# Patient Record
Sex: Female | Born: 1970 | Race: White | Hispanic: No | Smoking: Current every day smoker
Health system: Southern US, Community
[De-identification: ages and names within clinical notes are randomized; demographics above are authoritative.]

## PROBLEM LIST (undated history)

## (undated) DIAGNOSIS — E7800 Pure hypercholesterolemia, unspecified: Secondary | ICD-10-CM

## (undated) DIAGNOSIS — G43909 Migraine, unspecified, not intractable, without status migrainosus: Secondary | ICD-10-CM

## (undated) DIAGNOSIS — E0590 Thyrotoxicosis, unspecified without thyrotoxic crisis or storm: Secondary | ICD-10-CM

## (undated) DIAGNOSIS — N809 Endometriosis, unspecified: Secondary | ICD-10-CM

## (undated) DIAGNOSIS — R000 Tachycardia, unspecified: Secondary | ICD-10-CM

## (undated) DIAGNOSIS — R202 Paresthesia of skin: Secondary | ICD-10-CM

## (undated) DIAGNOSIS — G43009 Migraine without aura, not intractable, without status migrainosus: Secondary | ICD-10-CM

## (undated) HISTORY — PX: NASAL SINUS SURGERY: SHX719

## (undated) HISTORY — PX: VAGINAL HYSTERECTOMY: SUR661

## (undated) HISTORY — DX: Migraine, unspecified, not intractable, without status migrainosus: G43.909

## (undated) HISTORY — PX: EXPLORATORY LAPAROTOMY: SUR591

## (undated) HISTORY — DX: Migraine without aura, not intractable, without status migrainosus: G43.009

## (undated) HISTORY — DX: Paresthesia of skin: R20.2

## (undated) HISTORY — DX: Endometriosis, unspecified: N80.9

## (undated) HISTORY — DX: Thyrotoxicosis, unspecified without thyrotoxic crisis or storm: E05.90

## (undated) HISTORY — DX: Pure hypercholesterolemia, unspecified: E78.00

## (undated) HISTORY — PX: CHOLECYSTECTOMY: SHX55

---
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
## 2008-04-30 ENCOUNTER — Ambulatory Visit: Payer: Self-pay | Admitting: Family

## 2008-12-19 ENCOUNTER — Ambulatory Visit: Payer: Self-pay | Admitting: Family

## 2009-11-09 IMAGING — US ULTRASOUND RIGHT BREAST
1 series · 14 of 14 positions shown · non-contrast
Comparison: none

REASON FOR EXAM: Papable breast tenderness right breast
COMMENTS:

[Series 1: ultrasound right breast · 14 of 14 slices shown]
[im 1/14]
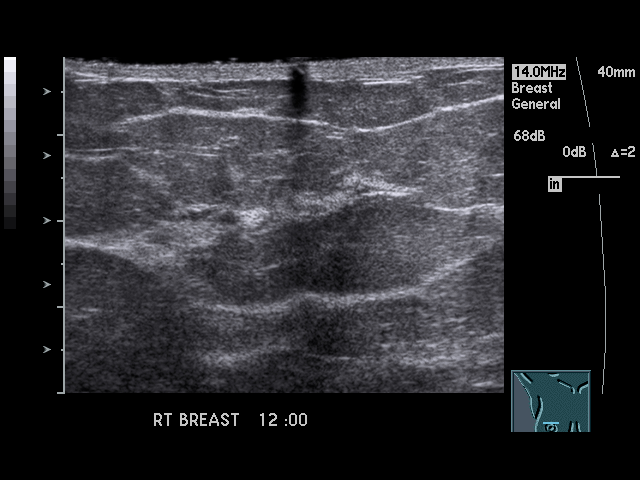
[im 2/14]
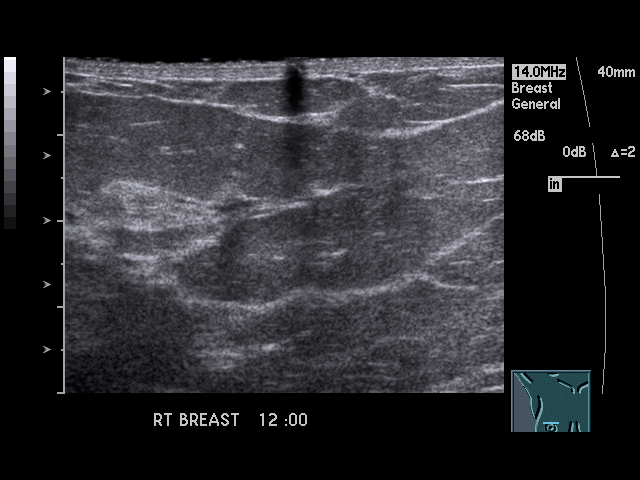
[im 3/14]
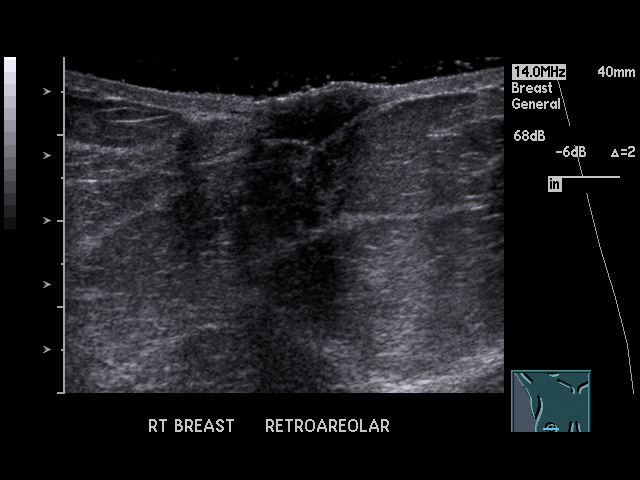
[im 4/14]
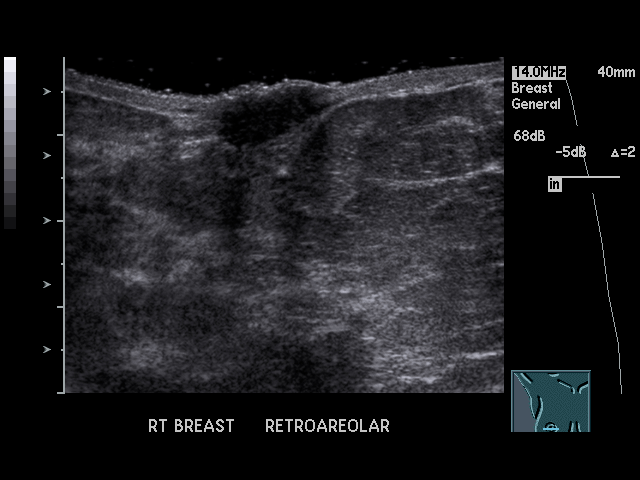
[im 5/14]
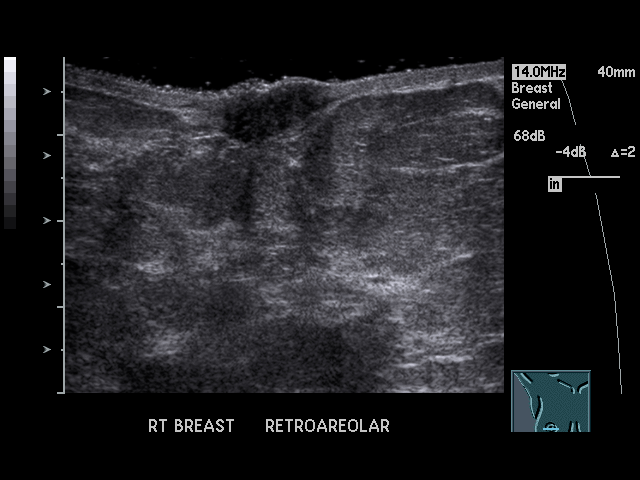
[im 6/14]
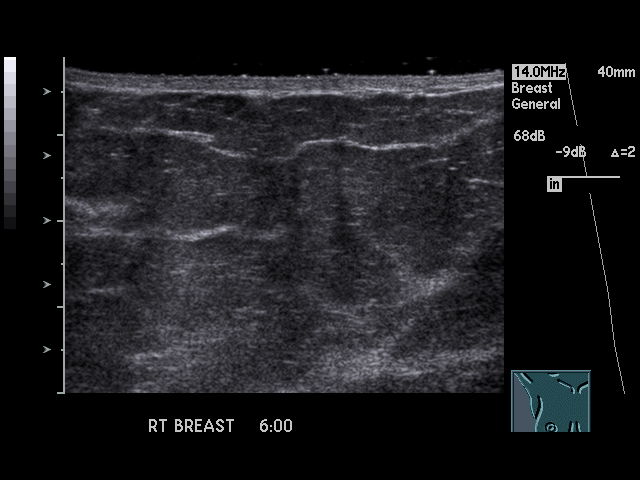
[im 7/14]
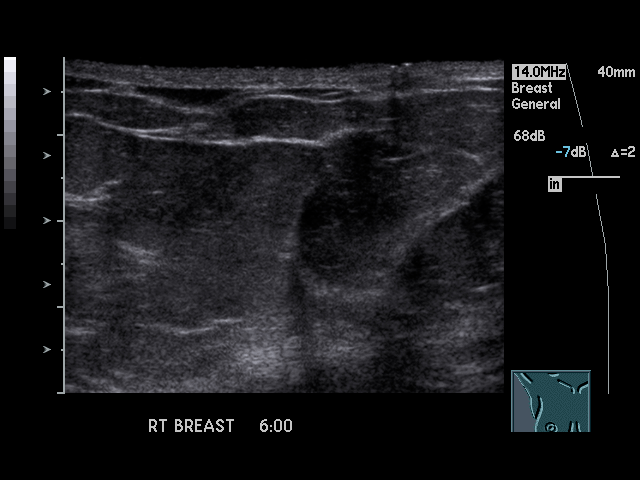
[im 8/14]
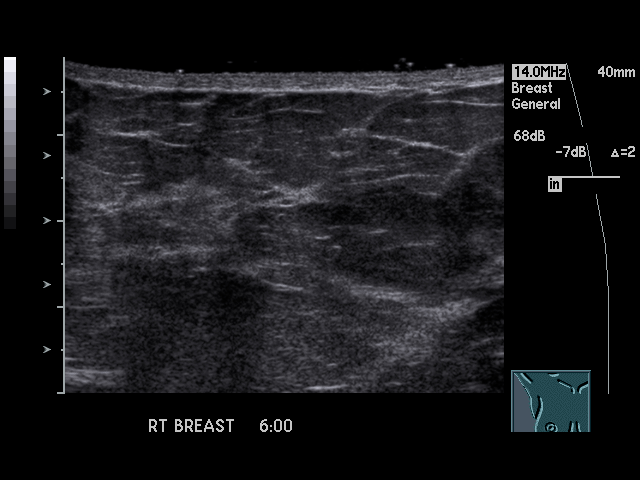
[im 9/14]
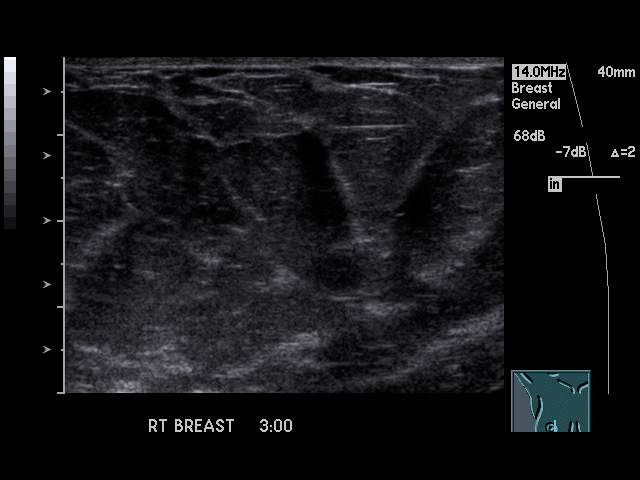
[im 10/14]
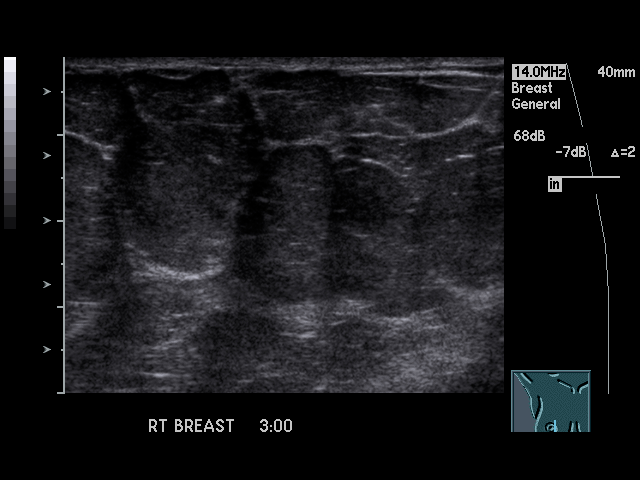
[im 11/14]
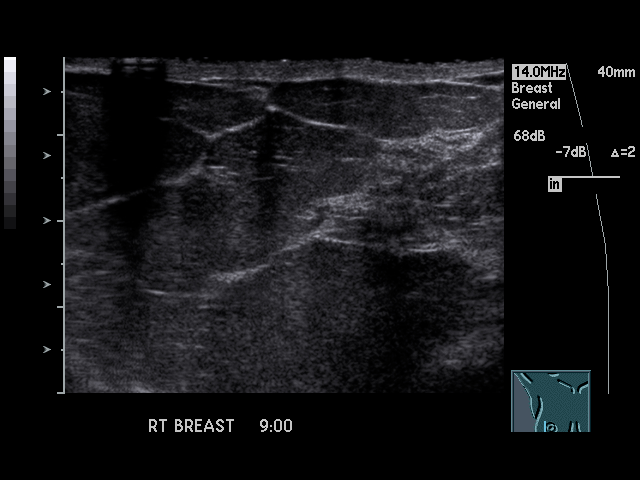
[im 12/14]
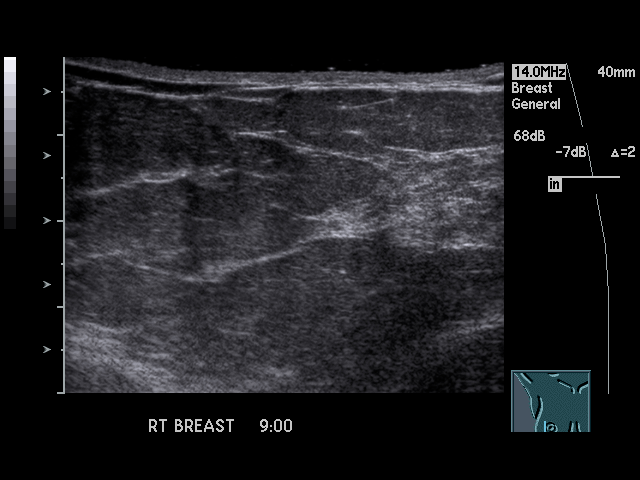
[im 13/14]
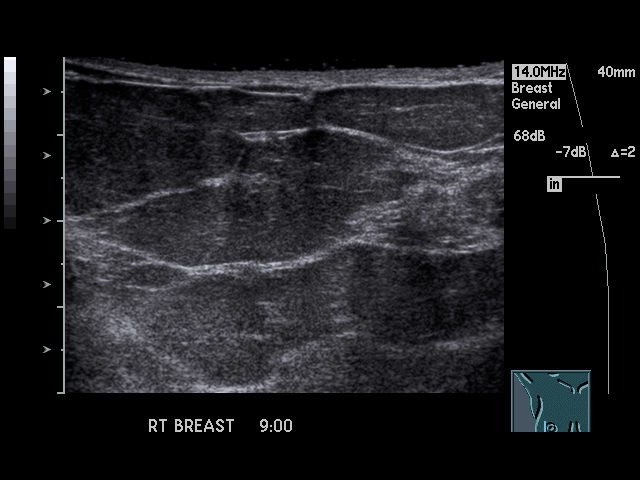
[im 14/14]
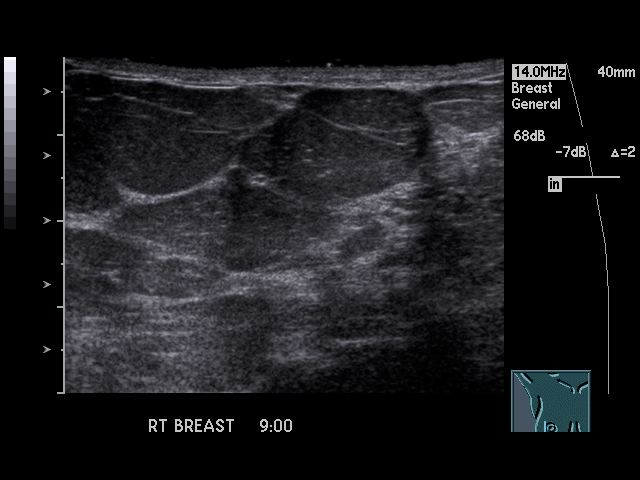

[14 of 14 positions shown; findings below may reference images not displayed]

PROCEDURE:     US  - US BREAST RIGHT  - April 30, 2008  [DATE]

RESULT:     The patient reports tenderness inferior to the RIGHT areolar
region.

Targeted Ultrasound was performed and shows no significant sonographic
abnormalities. No mass is seen. No skin thickening is noted. No dilated
ducts are observed.
IMPRESSION: Benign appearing Targeted RIGHT Breast Ultrasound.

## 2011-09-23 ENCOUNTER — Emergency Department: Payer: Self-pay | Admitting: Unknown Physician Specialty

## 2011-09-23 LAB — COMPREHENSIVE METABOLIC PANEL
Albumin: 3.7 g/dL (ref 3.4–5.0)
Alkaline Phosphatase: 97 U/L (ref 50–136)
Anion Gap: 7 (ref 7–16)
Bilirubin,Total: 0.4 mg/dL (ref 0.2–1.0)
Chloride: 105 mmol/L (ref 98–107)
Co2: 25 mmol/L (ref 21–32)
Creatinine: 0.63 mg/dL (ref 0.60–1.30)
EGFR (Non-African Amer.): >60
Glucose: 86 mg/dL (ref 65–99)
Osmolality: 272 (ref 275–301)
Potassium: 3.3 mmol/L — ABNORMAL LOW (ref 3.5–5.1)
SGPT (ALT): 23 U/L
Sodium: 137 mmol/L (ref 136–145)
Total Protein: 7.8 g/dL (ref 6.4–8.2)

## 2011-09-23 LAB — CBC
MCH: 33.8 pg (ref 26.0–34.0)
MCHC: 35.5 g/dL (ref 32.0–36.0)
Platelet: 242 10*3/uL (ref 150–440)
RBC: 4.68 10*6/uL (ref 3.80–5.20)

## 2011-09-23 LAB — LIPASE, BLOOD: Lipase: 138 U/L (ref 73–393)

## 2012-10-18 ENCOUNTER — Ambulatory Visit: Payer: Self-pay | Admitting: Family Medicine

## 2013-12-19 ENCOUNTER — Ambulatory Visit: Payer: Self-pay | Admitting: Family Medicine

## 2014-12-17 ENCOUNTER — Other Ambulatory Visit: Payer: Self-pay | Admitting: Family Medicine

## 2014-12-17 ENCOUNTER — Ambulatory Visit
Admission: RE | Admit: 2014-12-17 | Discharge: 2014-12-17 | Disposition: A | Discharge status: Home / Self Care | Payer: No Typology Code available for payment source | Source: Ambulatory Visit | Attending: Family Medicine | Admitting: Family Medicine

## 2014-12-17 DIAGNOSIS — R1031 Right lower quadrant pain: Secondary | ICD-10-CM | POA: Diagnosis not present

## 2014-12-17 DIAGNOSIS — R1084 Generalized abdominal pain: Secondary | ICD-10-CM

## 2014-12-17 HISTORY — DX: Tachycardia, unspecified: R00.0

## 2014-12-17 MED ORDER — IOHEXOL 300 MG/ML  SOLN
100.0000 mL | Freq: Once | INTRAMUSCULAR | Status: AC | PRN
  Administered 2014-12-17: 100 mL via INTRAVENOUS

## 2015-11-04 ENCOUNTER — Ambulatory Visit (INDEPENDENT_AMBULATORY_CARE_PROVIDER_SITE_OTHER): Payer: BLUE CROSS/BLUE SHIELD | Admitting: Neurology

## 2015-11-04 ENCOUNTER — Encounter: Payer: Self-pay | Admitting: Neurology

## 2015-11-04 VITALS — BP 131/83 | HR 89 | Ht 66.0 in | Wt 218.0 lb

## 2015-11-04 DIAGNOSIS — M25569 Pain in unspecified knee: Secondary | ICD-10-CM | POA: Insufficient documentation

## 2015-11-04 DIAGNOSIS — M25561 Pain in right knee: Secondary | ICD-10-CM | POA: Diagnosis not present

## 2015-11-04 DIAGNOSIS — G43009 Migraine without aura, not intractable, without status migrainosus: Secondary | ICD-10-CM | POA: Diagnosis not present

## 2015-11-04 DIAGNOSIS — M25562 Pain in left knee: Secondary | ICD-10-CM | POA: Diagnosis not present

## 2015-11-04 DIAGNOSIS — R202 Paresthesia of skin: Secondary | ICD-10-CM | POA: Diagnosis not present

## 2015-11-04 HISTORY — DX: Paresthesia of skin: R20.2

## 2015-11-04 HISTORY — DX: Migraine without aura, not intractable, without status migrainosus: G43.009

## 2015-11-04 NOTE — Progress Notes (Signed)
Reason for visit: Back pain, leg pain  Referring physician: Dr. Elza RafterAldridge  Elizabeth Carrillo is a 45 y.o. female  History of present illness:  Elizabeth Carrillo is a 45 year old right-handed white female with a history of migraine headaches occurring on average about once a week, treated with Excedrin Migraine and amitriptyline. The patient has a history of back pain and right-sided sciatica dating back to 2003. The pain is still there to some degree, but is less severe than it was initially. The patient has had some similar problems with the left side beginning two-years ago. The patient now believes that the left leg is worse than the right. The patient reports some discomfort going down both legs, but she now has achy pains from the mid thigh level down to the feet, she has some paresthesias involving the calf muscle area bilaterally, not involving the feet. The patient reports some left foot pain associated with plantar fasciitis. She indicates that her discomfort is worse when she is sitting, she feels better when she is up and moving around. The patient denies any weakness in the legs. She denies any difficulty with balance or difficulty controlling the bowels or the bladder. The patient does have some tightness in the neck and shoulders, she denies overt neck pain or pain down the arms. She denies any numbness or weakness of the arms. She has undergone an EMG and nerve conduction study that suggested a sensory neuropathy, but I reviewed the study which reveals that the study was done only on the left leg, plantar sensory latencies were done bilaterally, only the medial plantar sensory latency on the left foot was minimally prolonged with normal sensory amplitudes. The study was essentially normal, there is no evidence of a peripheral neuropathy. She is sent to this office for an evaluation. Other blood work has been done including a comprehensive metabolic profile, C-reactive protein, sedimentation  rate, rheumatoid factor, ANA, and vitamin B12 level, the studies were relatively unremarkable. A hemoglobin A1c was 5.4. A TSH done in August 2016 was elevated at 7.16.  Past Medical History  Diagnosis Date  . Tachycardia   . Endometriosis   . Hyperthyroidism   . High cholesterol   . Migraine   . Paresthesia 11/04/2015  . Common migraine 11/04/2015    Past Surgical History  Procedure Laterality Date  . Rhinoplasty  1992  . Vaginal hysterectomy  Age 45  . Cholecystectomy  Age 45  . Nasal sinus surgery  Age [age]  . Exploratory laparotomy  Age 45    x 4    Family History  Problem Relation Age of Onset  . Cancer Mother   . Cancer Father   . High Cholesterol Mother   . High Cholesterol Brother   . High Cholesterol Sister   . High Cholesterol Maternal Grandmother   . Heart disease Maternal Grandmother   . Diabetes Paternal Grandfather     Social history:  reports that she has been smoking Cigarettes.  She has been smoking about 0.20 packs per day. She does not have any smokeless tobacco history on file. She reports that she drinks alcohol. She reports that she does not use illicit drugs.  Medications:  Prior to Admission medications   Not on File      Allergies  Allergen Reactions  . Codeine Hives  . Lactose Nausea Only    Other reaction(s): Abdominal Pain    ROS:  Out of a complete 14 system review of symptoms, the patient  complains only of the following symptoms, and all other reviewed systems are negative.  Weight gain Constipation Increased thirst Achy muscles  Blood pressure 131/83, pulse 89, height 5\' 6"  (1.676 m), weight 218 lb (98.884 kg).  Physical Exam  General: The patient is alert and cooperative at the time of the examination. The patient is moderately to markedly obese.  Eyes: Pupils are equal, round, and reactive to light. Discs are flat bilaterally.  Neck: The neck is supple, no carotid bruits are noted.  Respiratory: The respiratory  examination is clear.  Cardiovascular: The cardiovascular examination reveals a regular rate and rhythm, no obvious murmurs or rubs are noted.  Neuromuscular: Range of movement of the low back is full. There is some tenderness over the right SI joint.  Skin: Extremities are with 1+ edema at the ankles bilaterally.  Neurologic Exam  Mental status: The patient is alert and oriented x 3 at the time of the examination. The patient has apparent normal recent and remote memory, with an apparently normal attention span and concentration ability.  Cranial nerves: Facial symmetry is present. There is good sensation of the face to pinprick and soft touch bilaterally. The strength of the facial muscles and the muscles to head turning and shoulder shrug are normal bilaterally. Speech is well enunciated, no aphasia or dysarthria is noted. Extraocular movements are full. Visual fields are full. The tongue is midline, and the patient has symmetric elevation of the soft palate. No obvious hearing deficits are noted.  Motor: The motor testing reveals 5 over 5 strength of all 4 extremities. Good symmetric motor tone is noted throughout.  Sensory: Sensory testing is intact to pinprick, soft touch, vibration sensation, and position sense on all 4 extremities. No evidence of extinction is noted.  Coordination: Cerebellar testing reveals good finger-nose-finger and heel-to-shin bilaterally.  Gait and station: Gait is normal. Tandem gait is normal. Romberg is negative. No drift is seen. The patient is able to walk on heels and the toes bilaterally.  Reflexes: Deep tendon reflexes are symmetric and normal bilaterally. Toes are downgoing bilaterally.   Assessment/Plan:  1. Chronic bilateral sciatica pain   2. Migraine headache  3. Obesity  The patient has chronic discomfort down both legs. She likely does not have a peripheral neuropathy. We will check MRI evaluation of the lumbosacral spine, the patient may  be amenable to epidural steroid injection. She is on Lyrica with some benefit, this seems to work better than the gabapentin. She is reporting some ankle swelling on the medication. She will follow-up in 3-4 months.  Marlan Palau. Keith Aissa Lisowski MD 11/04/2015 7:06 PM  [facility] [address] [place], [place] [id number]  Phone [phone] Fax [phone]

## 2015-11-04 NOTE — Patient Instructions (Signed)

## 2015-11-08 ENCOUNTER — Encounter: Payer: Self-pay | Admitting: Neurology

## 2015-12-02 ENCOUNTER — Ambulatory Visit
Admission: RE | Admit: 2015-12-02 | Discharge: 2015-12-02 | Disposition: A | Discharge status: Home / Self Care | Payer: BLUE CROSS/BLUE SHIELD | Source: Ambulatory Visit | Attending: Neurology | Admitting: Neurology

## 2015-12-02 ENCOUNTER — Telehealth: Payer: Self-pay | Admitting: Neurology

## 2015-12-02 DIAGNOSIS — M25562 Pain in left knee: Secondary | ICD-10-CM

## 2015-12-02 DIAGNOSIS — R202 Paresthesia of skin: Secondary | ICD-10-CM

## 2015-12-02 DIAGNOSIS — M25561 Pain in right knee: Secondary | ICD-10-CM

## 2015-12-02 NOTE — Telephone Encounter (Signed)
MRI of the lumbar spine shows minimal facet joint arthritis, no nerve root impingement. Would continue with medical therapy at this point, not sure that an epidural steroid injection with help her. Chiropractic treatments may be of some benefit.   MRI lumbar 12/02/15:  IMPRESSION: Mild facet degeneration L4-5 and L5-S1. No disc protrusion or neural impingement.

## 2016-03-07 ENCOUNTER — Ambulatory Visit: Payer: BLUE CROSS/BLUE SHIELD | Admitting: Adult Health

## 2016-03-08 ENCOUNTER — Encounter: Payer: Self-pay | Admitting: Adult Health

## 2017-01-08 ENCOUNTER — Emergency Department
Admission: EM | Admit: 2017-01-08 | Discharge: 2017-01-08 | Disposition: A | Discharge status: Home Health | Payer: Self-pay | Attending: Emergency Medicine | Admitting: Emergency Medicine

## 2017-01-08 ENCOUNTER — Emergency Department: Payer: Self-pay

## 2017-01-08 ENCOUNTER — Encounter: Payer: Self-pay | Admitting: Emergency Medicine

## 2017-01-08 DIAGNOSIS — Z79899 Other long term (current) drug therapy: Secondary | ICD-10-CM | POA: Insufficient documentation

## 2017-01-08 DIAGNOSIS — J189 Pneumonia, unspecified organism: Secondary | ICD-10-CM

## 2017-01-08 DIAGNOSIS — J181 Lobar pneumonia, unspecified organism: Secondary | ICD-10-CM | POA: Insufficient documentation

## 2017-01-08 DIAGNOSIS — F17210 Nicotine dependence, cigarettes, uncomplicated: Secondary | ICD-10-CM | POA: Insufficient documentation

## 2017-01-08 MED ORDER — BENZONATATE 100 MG PO CAPS: 200.0000 mg | ORAL_CAPSULE | Freq: Three times a day (TID) | ORAL | 0 refills | Status: AC | PRN

## 2017-01-08 MED ORDER — PREDNISONE 20 MG PO TABS
40.0000 mg | ORAL_TABLET | Freq: Every day | ORAL | Status: DC
  Administered 2017-01-08: 40 mg via ORAL

## 2017-01-08 MED ORDER — AZITHROMYCIN 250 MG PO TABS: ORAL_TABLET | ORAL | 0 refills | Status: AC

## 2017-01-08 MED ORDER — IPRATROPIUM-ALBUTEROL 0.5-2.5 (3) MG/3ML IN SOLN
3.0000 mL | Freq: Once | RESPIRATORY_TRACT | Status: AC
  Administered 2017-01-08: 3 mL via RESPIRATORY_TRACT
  Filled 2017-01-08: qty 3

## 2017-01-08 MED ORDER — AZITHROMYCIN 250 MG PO TABS: ORAL_TABLET | ORAL | 0 refills | Status: DC

## 2017-01-08 MED ORDER — AZITHROMYCIN 500 MG PO TABS
500.0000 mg | ORAL_TABLET | Freq: Once | ORAL | Status: AC
  Administered 2017-01-08: 500 mg via ORAL
  Filled 2017-01-08: qty 1

## 2017-01-08 MED ORDER — PREDNISONE 20 MG PO TABS
ORAL_TABLET | ORAL | Status: AC
  Filled 2017-01-08: qty 2

## 2017-01-08 MED ORDER — BENZONATATE 100 MG PO CAPS
200.0000 mg | ORAL_CAPSULE | Freq: Once | ORAL | Status: AC
  Administered 2017-01-08: 200 mg via ORAL
  Filled 2017-01-08: qty 2

## 2017-01-08 MED ORDER — BENZONATATE 100 MG PO CAPS: 200.0000 mg | ORAL_CAPSULE | Freq: Three times a day (TID) | ORAL | 0 refills | Status: DC | PRN

## 2017-01-08 NOTE — ED Triage Notes (Signed)
Pt c/o cold sxs x over 1 week. States she has had cold sweats and fevers at onset, but states now she is having a productive cough yellow thick sputum. Cough worse at night unable to lay down.   Pt is a smoker. No hx of asthma, pneumonia or COPD.

## 2017-01-08 NOTE — ED Provider Notes (Signed)
[facility] Emergency Department Provider Note  ___________________________________________   First MD Initiated Contact with Patient 01/08/17 1612     (approximate)  I have reviewed the triage vital signs and the nursing notes.   HISTORY  Chief Complaint Cough   HPI Hanalei Talma Aguillard is a 46 y.o. female is here with complaint of upper respiratory symptoms for over a week. Patient states that she has had some "cold sweats" and fever at times. She now complains of a yellow productive cough. She states cough is worse at night when she lies down. Patient denies any history of asthma or pneumonia in the past. Patient is a smoker at one pack per day. Patient is tried some over-the-counter medication without any relief of her symptoms. She is continued to work with these symptoms.   Past Medical History:  Diagnosis Date  . Common migraine 11/04/2015  . Endometriosis   . High cholesterol   . Hyperthyroidism   . Migraine   . Paresthesia 11/04/2015  . Tachycardia     Patient Active Problem List   Diagnosis Date Noted  . Paresthesia 11/04/2015  . Pain in joint, lower leg 11/04/2015  . Common migraine 11/04/2015    Past Surgical History:  Procedure Laterality Date  . CHOLECYSTECTOMY  Age 85  . EXPLORATORY LAPAROTOMY  Age 40   x 4  . NASAL SINUS SURGERY  Age 40  . RHINOPLASTY  1992  . VAGINAL HYSTERECTOMY  Age 77    Prior to Admission medications   Medication Sig Start Date End Date Taking? Authorizing Provider  amitriptyline (ELAVIL) 50 MG tablet Take 50 mg by mouth. 03/31/15 12/30/15  [provider]  azithromycin (ZITHROMAX Z-PAK) 250 MG tablet Take 1 tablet once a day for the next 4 days starting 01/09/17 01/08/17   Tommi Rumps, PA-C  benzonatate (TESSALON PERLES) 100 MG capsule Take 2 capsules (200 mg total) by mouth 3 (three) times daily as needed. 01/08/17 01/08/18  Tommi Rumps, PA-C  furosemide (LASIX) 40 MG tablet Take 40 mg by  mouth. 11/02/15 11/01/16  [provider]  levothyroxine (SYNTHROID, LEVOTHROID) 25 MCG tablet Take by mouth. 07/02/15 07/01/16  [provider]  lovastatin (MEVACOR) 40 MG tablet Take 40 mg by mouth. 03/31/15 12/30/15  [provider]  naproxen (NAPROXEN DR) 500 MG EC tablet Take 500 mg by mouth. 05/27/15   [provider]  pantoprazole (PROTONIX) 40 MG tablet Take 40 mg by mouth. 05/26/15 01/03/16  [provider]  pregabalin (LYRICA) 50 MG capsule Take 50 mg by mouth. 09/29/15 09/28/16  [provider]    Allergies Codeine and Lactose  Family History  Problem Relation Age of Onset  . Cancer Mother   . High Cholesterol Mother   . Cancer Father   . High Cholesterol Brother   . High Cholesterol Sister   . High Cholesterol Maternal Grandmother   . Heart disease Maternal Grandmother   . Diabetes Paternal Grandfather     Social History Social History  Substance Use Topics  . Smoking status: Current Every Day Smoker    Packs/day: 1.00    Types: Cigarettes  . Smokeless tobacco: Never Used  . Alcohol use 0.0 oz/week     Comment: occasional    Review of Systems Constitutional: No fever/chills Cardiovascular: Denies chest pain. Respiratory: Denies shortness of breath.Positive productive cough. Gastrointestinal:   No nausea, no vomiting. Skin: Negative for rash. Neurological: Negative for headaches ____________________________________________   PHYSICAL EXAM:  VITAL SIGNS: ED Triage Vitals  Enc Vitals Group     BP 01/08/17 1547 132/75     Pulse Rate 01/08/17 1547 (!) 113     Resp 01/08/17 1547 18     Temp 01/08/17 1547 99.5 F (37.5 C)     Temp Source 01/08/17 1547 Oral     SpO2 01/08/17 1547 95 %     Weight 01/08/17 1548 203 lb (92.1 kg)     Height 01/08/17 1548 5\' 7"  (1.702 m)     Head Circumference --      Peak Flow --      Pain Score 01/08/17 1600 0     Pain Loc --      Pain Edu? --      Excl. in GC? --     Constitutional: Alert and oriented. Well appearing and in no acute distress. Eyes: Conjunctivae are normal.  Head: Atraumatic. Nose: Mild congestion/rhinnorhea.  EACs and TMs are clear bilaterally. Mouth/Throat: Mucous membranes are moist.  Oropharynx non-erythematous. Neck: No stridor.   Hematological/Lymphatic/Immunilogical: No cervical lymphadenopathy. Cardiovascular: Normal rate, regular rhythm. Grossly normal heart sounds.  Good peripheral circulation. Respiratory: Normal respiratory effort.  No retractions. Lungs CTAB. Gastrointestinal: Soft and nontender. No distention.  Musculoskeletal: Moves upper and lower extremities without any difficulty. Normal gait was noted. Neurologic:  Normal speech and language. No gross focal neurologic deficits are appreciated.  Skin:  Skin is warm, dry and intact.  Psychiatric: Mood and affect are normal. Speech and behavior are normal.  ____________________________________________   LABS (all labs ordered are listed, but only abnormal results are displayed)  Labs Reviewed - No data to display  RADIOLOGY  Dg Chest 2 View  Result Date: 01/08/2017 CLINICAL DATA:  46 year old female with cough for 1 week. EXAM: CHEST  2 VIEW COMPARISON:  None. FINDINGS: Focal airspace opacity within the left upper lobe/lingula likely represents pneumonia. The cardiomediastinal silhouette is unremarkable. There is no evidence of pulmonary edema, suspicious pulmonary nodule/mass, pleural effusion, or pneumothorax. No acute bony abnormalities are identified. IMPRESSION: Left upper lobe/ lingular airspace disease compatible with pneumonia. Radiographic follow-up to resolution recommended. Electronically Signed   By: Harmon PierJeffrey  Hu M.D.   On: 01/08/2017 16:55    ____________________________________________   PROCEDURES  Procedure(s) performed: None  Procedures  Critical Care performed: No  ____________________________________________   INITIAL IMPRESSION /  ASSESSMENT AND PLAN / ED COURSE  Pertinent labs & imaging results that were available during my care of the patient were reviewed by me and considered in my medical decision making (see chart for details).  Patient was given her first dose of Zithromax in the emergency department since it is doubtful that she will be able to get to the pharmacy before it closes on Sunday evening. She was given a prescription for Zithromax 1 tablet daily for the next 4 days. She also Tessalon Perles 200 mg every 8 hours as needed for cough. Patient does not have a PCP. She was given a list of clinics that she could follow-up with including [facility], [facility], [facility], [facility] or [facility]. Patient does not have insurance and was given a good Rx card and a coupon showing that the cheapest placed by her medication was here [facility] [employer] [facility].    ___________________________________________   FINAL CLINICAL IMPRESSION(S) / ED DIAGNOSES  Final diagnoses:  Community acquired pneumonia of left upper lobe of lung (HCC)      NEW MEDICATIONS  STARTED DURING THIS VISIT:  Discharge Medication List as of 01/08/2017  5:27 PM       Note:  This document was prepared using Dragon voice recognition software and may include unintentional dictation errors.    Tommi Rumps, PA-C 01/08/17 1830    Arnaldo Natal, MD 01/08/17 2120

## 2017-01-08 NOTE — Discharge Instructions (Addendum)
Follow-up with one of the doctors listed on your discharge papers. [facility] may call [facility], [facility], [facility], [facility], or [facility]. Your first dose of Zithromax and Tessalon was given in the emergency department since we are unsure what the pharmacies are open past 6 PM. Get  prescriptions filled tomorrow. A discount card is included in your discharge papers and a coupon showing that the cheapest place to buy this medication is at [facility].

## 2018-07-20 IMAGING — CR DG CHEST 2V
1 series · 2 of 2 positions shown · non-contrast
Comparison: None.

CLINICAL DATA: 46-year-old female with cough for 1 week.

EXAM:
CHEST  2 VIEW

[Series 1: w chest pa · 0.14mm/px · 2 of 2 slices shown]
[im 1/2]
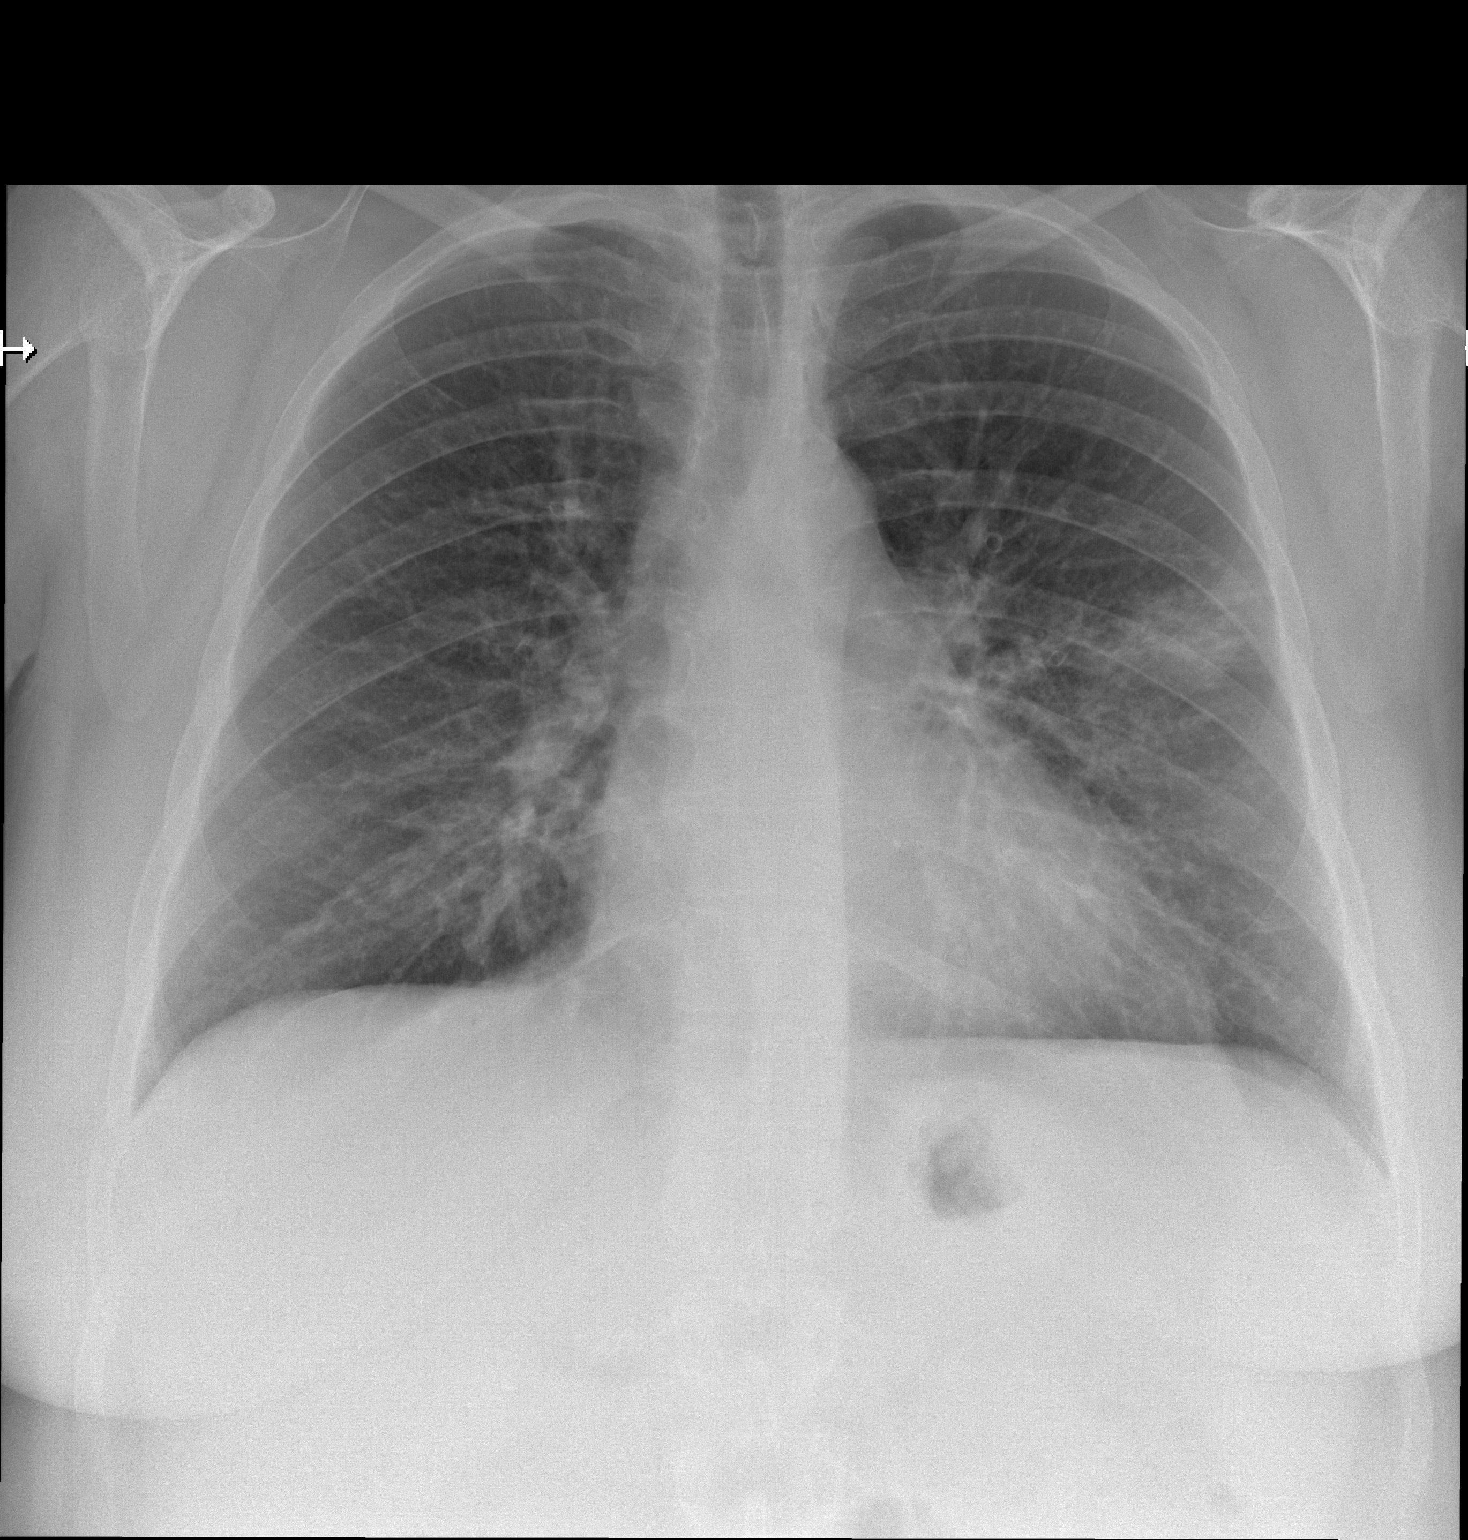
[im 2/2]
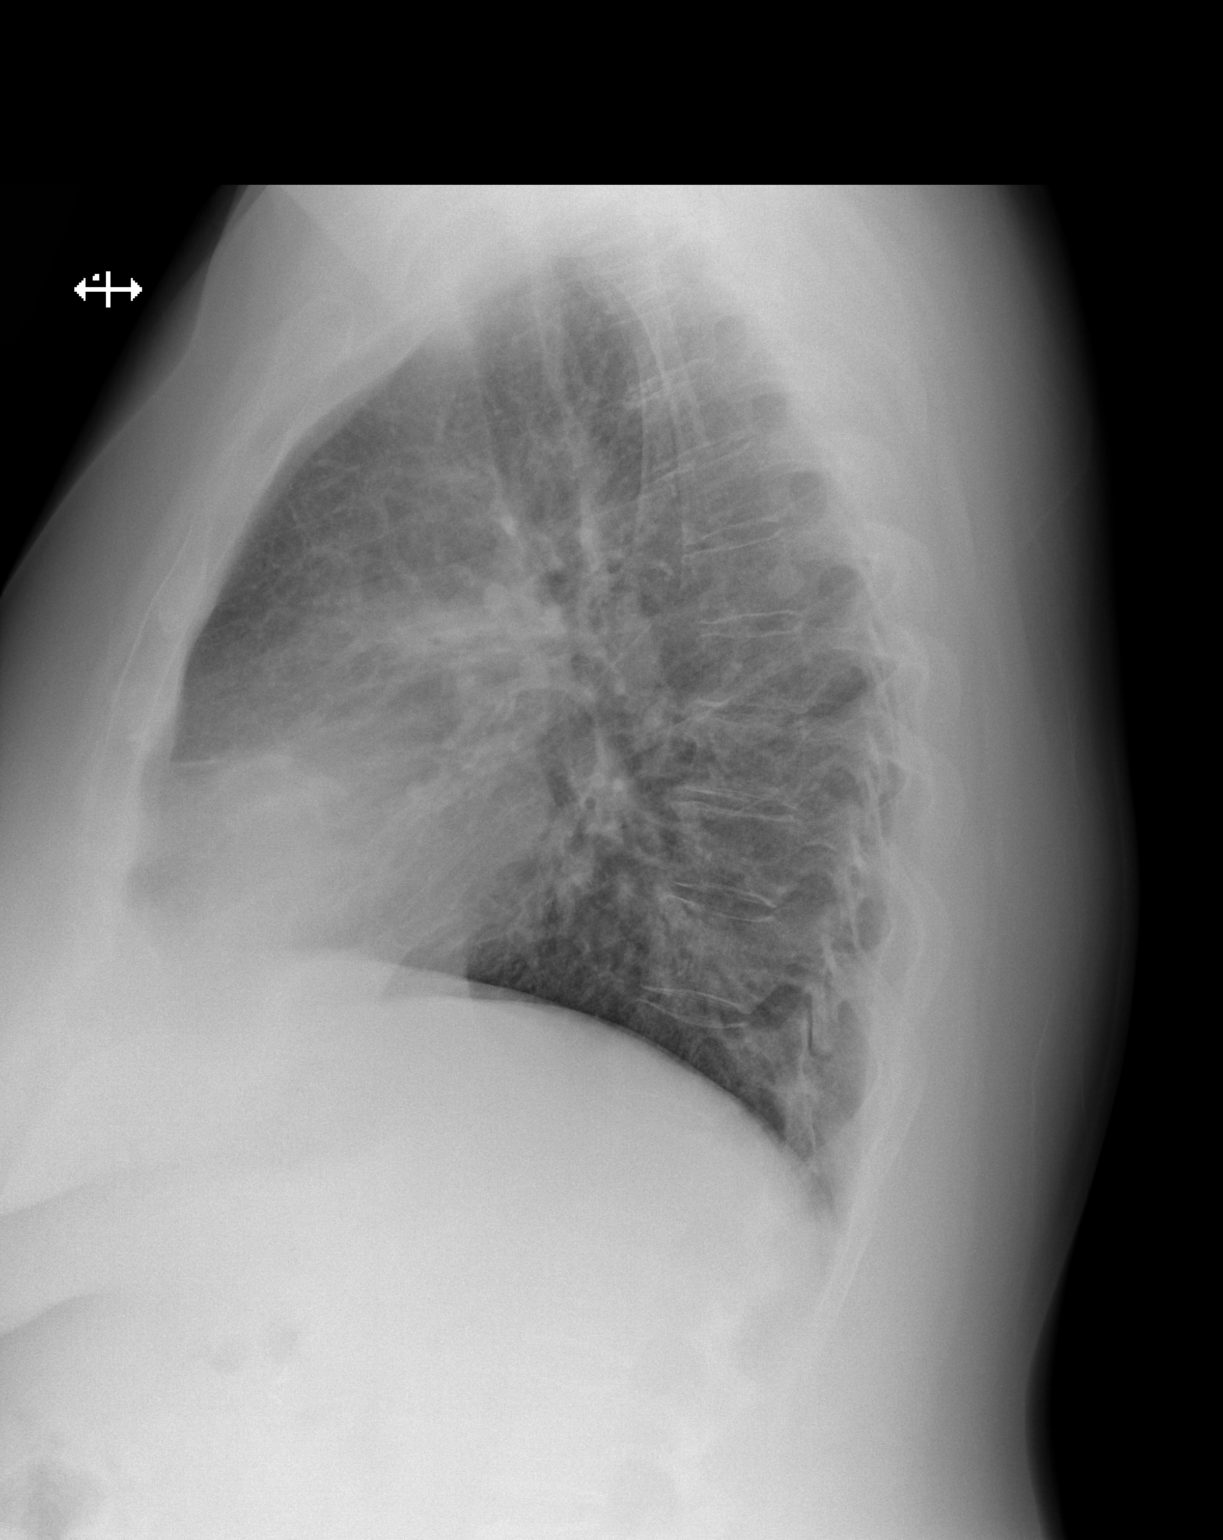

[2 of 2 positions shown; findings below may reference images not displayed]

FINDINGS: Focal airspace opacity within the left upper lobe/lingula likely
represents pneumonia.

The cardiomediastinal silhouette is unremarkable.

There is no evidence of pulmonary edema, suspicious pulmonary
nodule/mass, pleural effusion, or pneumothorax. No acute bony
abnormalities are identified.
IMPRESSION: Left upper lobe/ lingular airspace disease compatible with
pneumonia. Radiographic follow-up to resolution recommended.
